# Patient Record
Sex: Female | Born: 1940 | Race: White | Hispanic: No | Smoking: Never smoker
Health system: Southern US, Community
[De-identification: ages and names within clinical notes are randomized; demographics above are authoritative.]

## PROBLEM LIST (undated history)

## (undated) DIAGNOSIS — R232 Flushing: Secondary | ICD-10-CM

## (undated) DIAGNOSIS — M75102 Unspecified rotator cuff tear or rupture of left shoulder, not specified as traumatic: Secondary | ICD-10-CM

## (undated) DIAGNOSIS — E785 Hyperlipidemia, unspecified: Secondary | ICD-10-CM

## (undated) DIAGNOSIS — F419 Anxiety disorder, unspecified: Secondary | ICD-10-CM

## (undated) DIAGNOSIS — G2581 Restless legs syndrome: Secondary | ICD-10-CM

## (undated) DIAGNOSIS — J309 Allergic rhinitis, unspecified: Secondary | ICD-10-CM

## (undated) DIAGNOSIS — G4700 Insomnia, unspecified: Secondary | ICD-10-CM

## (undated) DIAGNOSIS — Z87898 Personal history of other specified conditions: Secondary | ICD-10-CM

## (undated) DIAGNOSIS — I10 Essential (primary) hypertension: Secondary | ICD-10-CM

## (undated) HISTORY — DX: Hyperlipidemia, unspecified: E78.5

## (undated) HISTORY — DX: Flushing: R23.2

## (undated) HISTORY — DX: Unspecified rotator cuff tear or rupture of left shoulder, not specified as traumatic: M75.102

## (undated) HISTORY — DX: Essential (primary) hypertension: I10

## (undated) HISTORY — DX: Restless legs syndrome: G25.81

## (undated) HISTORY — DX: Insomnia, unspecified: G47.00

## (undated) HISTORY — DX: Personal history of other specified conditions: Z87.898

## (undated) HISTORY — PX: ABDOMINAL HYSTERECTOMY: SHX81

## (undated) HISTORY — DX: Anxiety disorder, unspecified: F41.9

## (undated) HISTORY — DX: Allergic rhinitis, unspecified: J30.9

---

## 2013-07-14 ENCOUNTER — Ambulatory Visit (INDEPENDENT_AMBULATORY_CARE_PROVIDER_SITE_OTHER): Payer: Medicare Other | Admitting: Cardiology

## 2013-07-14 ENCOUNTER — Encounter (INDEPENDENT_AMBULATORY_CARE_PROVIDER_SITE_OTHER): Payer: Self-pay

## 2013-07-14 ENCOUNTER — Encounter: Payer: Self-pay | Admitting: *Deleted

## 2013-07-14 VITALS — BP 160/88 | HR 104 | Ht 61.0 in | Wt 144.0 lb

## 2013-07-14 DIAGNOSIS — R42 Dizziness and giddiness: Secondary | ICD-10-CM

## 2013-07-14 NOTE — Progress Notes (Signed)
Patient ID: Deborah Wall, female   DOB: 21-Oct-1940, 72 y.o.   MRN: [id number]     Patient Name: Deborah Wall Date of Encounter: 07/14/2013  Primary Care Provider:  No primary provider on file. Primary Cardiologist:  Tobias Alexander, H   Problem List   Past Medical History  Diagnosis Date  . Left rotator cuff tear   . HTN (hypertension)   . Hyperlipidemia   . Insomnia   . Anxiety   . Restless leg syndrome   . Hot flashes   . Allergic rhinitis   . H/O gynecomastia    Past Surgical History  Procedure Laterality Date  . Abdominal hysterectomy      Allergies  Allergies  Allergen Reactions  . Other     BEES= USES EPIPEN  . Vicodin [Hydrocodone-Acetaminophen]     JITTERY    HPI  72 year old younder appearing female with no prior cardiac history but a significant FH of CAD on both side of family. Multiple family members died in their 71' of myocardial infarction.  The patient complains that recently she started to feel significant shortness of breath on exertion that is progressively worsening, she used an example where walking to mailbox makes her short of breath. This is not associated with any chest pain. 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 This is very unusual for her and she is very concerned. She denies any prior history of syncope but states that recently standing up from sitting or laying makes her feel dizzy. She denies any orthopnea paroxysmal nocturnal dyspnea or lower extremity edema. She also complains of calf pain associated with walking especially on the right.  Home Medications  Prior to Admission medications   Medication Sig Start Date End Date Taking? Authorizing Provider  Cetirizine HCl (ZYRTEC ALLERGY PO) Take 10 mg by mouth daily.   Yes Historical Provider, MD  chlorpheniramine (CHLOR-TRIMETON) 4 MG tablet Take 4 mg by mouth as directed.   Yes Historical  Provider, MD  citalopram (CELEXA) 20 MG tablet Take 20 mg by mouth daily.   Yes Historical Provider, MD  docusate sodium (COLACE) 100 MG capsule Take 100 mg by mouth as directed.   Yes Historical Provider, MD  doxylamine, Sleep, (UNISOM) 25 MG tablet Take 25 mg by mouth at bedtime as needed.   Yes Historical Provider, MD  EPINEPHrine (EPIPEN IJ) Inject as directed.   Yes Historical Provider, MD  estrogens, conjugated, (PREMARIN) 0.3 MG tablet Take 0.3 mg by mouth daily. Take daily for 21 days then do not take for 7 days.   Yes Historical Provider, MD  estrogens, conjugated, (PREMARIN) 0.625 MG tablet Take 0.625 mg by mouth daily. Take daily for 21 days then do not take for 7 days.   Yes Historical Provider, MD  fluticasone (VERAMYST) 27.5 MCG/SPRAY nasal spray Place 2 sprays into the nose as directed.   Yes Historical Provider, MD  FLUTICASONE FUROATE NA Place into the nose.   Yes Historical Provider, MD  ibuprofen (ADVIL,MOTRIN) 800 MG tablet Take 800 mg by mouth every 8 (eight) hours as needed.   Yes Historical Provider, MD  rOPINIRole (REQUIP) 2 MG tablet Take 2 mg by mouth at bedtime.   Yes Historical Provider, MD  tizanidine (ZANAFLEX) 6 MG capsule Take 6 mg by mouth as directed.   Yes Historical Provider, MD    Family History  Family History  Problem Relation  Age of Onset  . Heart disease Mother   . Cancer Father     Social History  History   Social History  . Marital Status: Widowed    Spouse Name: N/A    Number of Children: N/A  . Years of Education: N/A   Occupational History  . Not on file.   Social History Main Topics  . Smoking status: Never Smoker   . Smokeless tobacco: Not on file  . Alcohol Use: No  . Drug Use: No  . Sexual Activity: Not on file   Other Topics Concern  . Not on file   Social History Narrative  . No narrative on file     Review of Systems, as per HPI, otherwise negative General:  No chills, fever, night sweats or weight changes.    Cardiovascular:  No chest pain, dyspnea on exertion, edema, orthopnea, palpitations, paroxysmal nocturnal dyspnea. Dermatological: No rash, lesions/masses Respiratory: No cough, dyspnea Urologic: No hematuria, dysuria Abdominal:   No nausea, vomiting, diarrhea, bright red blood per rectum, melena, or hematemesis Neurologic:  No visual changes, wkns, changes in mental status. All other systems reviewed and are otherwise negative except as noted above.  Physical Exam  Blood pressure 160/88, pulse 104, height 5\' 1"  (1.549 m), weight 144 lb (65.318 kg).  General: Pleasant, NAD Psych: Normal affect. Neuro: Alert and oriented X 3. Moves all extremities spontaneously. HEENT: Normal  Neck: Supple without bruits or JVD. Lungs:  Resp regular and unlabored, CTA. Heart: RRR no s3, s4, or murmurs. Abdomen: Soft, non-tender, non-distended, BS + x 4.  Extremities: No clubbing, cyanosis or edema. DP/PT/Radials 2+ and equal bilaterally.  Labs:  No results found for this basename: CKTOTAL, CKMB, TROPONINI,  in the last 72 hours No results found for this basename: WBC, HGB, HCT, MCV, PLT   No results found for this basename: NA, K, CL, CO2, BUN, CREATININE, CALCIUM, LABALBU, PROT, BILITOT, ALKPHOS, ALT, AST, GLUCOSE,  in the last 168 hours No results found for this basename: CHOL, HDL, LDLCALC, TRIG   No results found for this basename: DDIMER   No components found with this basename: POCBNP,   Accessory Clinical Findings  echocardiogram  ECG - sinus rhythm, 69 beats per minute, normal EKG    Assessment & Plan  72 year old female with significant FH of CAD on both side of family, 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 DOE - multiple risk factors - we will order an exercise treadmill stress test  2. Hypertension - uncontrolled, states that ok at home, if elevated at the stress test and hypertensive response during the test we will add antihypertensives  3. HLP  - just checked by PCP and started on lipitor  4. Claudications - weak pulses on the right, we will order B/L ABIs  5. Dizziness mostly orthostatic but sometimes not after standing up, we will order sono carotids b/l  Follow up in 1 month   Lars Masson, MD, [facility] 07/14/2013, 4:15 PM

## 2013-07-14 NOTE — Patient Instructions (Signed)
Your physician has requested that you have an ankle brachial index (ABI). During this test an ultrasound and blood pressure cuff are used to evaluate the arteries that supply the arms and legs with blood. Allow thirty minutes for this exam. There are no restrictions or special instructions.  Your physician has requested that you have en exercise stress myoview. Please follow instruction sheet, as given.  Your physician has requested that you have a carotid duplex. This test is an ultrasound of the carotid arteries in your neck. It looks at blood flow through these arteries that supply the brain with blood. Allow one hour for this exam. There are no restrictions or special instructions.  Your physician recommends that you schedule a follow-up appointment in: WITH DR. Delton See AFTER ALL TEST ARE COMPLETED TO REVIEW RESULTS

## 2013-07-20 ENCOUNTER — Ambulatory Visit (HOSPITAL_COMMUNITY): Payer: Medicare Other | Attending: Cardiology

## 2013-07-20 ENCOUNTER — Ambulatory Visit (HOSPITAL_BASED_OUTPATIENT_CLINIC_OR_DEPARTMENT_OTHER): Payer: Medicare Other

## 2013-07-20 DIAGNOSIS — R42 Dizziness and giddiness: Secondary | ICD-10-CM | POA: Insufficient documentation

## 2013-07-20 DIAGNOSIS — I6529 Occlusion and stenosis of unspecified carotid artery: Secondary | ICD-10-CM

## 2013-07-20 DIAGNOSIS — I739 Peripheral vascular disease, unspecified: Secondary | ICD-10-CM

## 2013-07-22 ENCOUNTER — Encounter (HOSPITAL_COMMUNITY): Payer: Self-pay | Admitting: Cardiology

## 2013-08-03 ENCOUNTER — Encounter: Payer: Self-pay | Admitting: *Deleted

## 2013-08-03 ENCOUNTER — Encounter: Payer: Self-pay | Admitting: Cardiology

## 2013-08-03 ENCOUNTER — Ambulatory Visit (HOSPITAL_COMMUNITY): Payer: Medicare Other | Attending: Cardiology | Admitting: Radiology

## 2013-08-03 VITALS — BP 175/70 | HR 54 | Ht 61.0 in | Wt 141.0 lb

## 2013-08-03 DIAGNOSIS — R0989 Other specified symptoms and signs involving the circulatory and respiratory systems: Secondary | ICD-10-CM | POA: Insufficient documentation

## 2013-08-03 DIAGNOSIS — R0602 Shortness of breath: Secondary | ICD-10-CM

## 2013-08-03 DIAGNOSIS — Z8249 Family history of ischemic heart disease and other diseases of the circulatory system: Secondary | ICD-10-CM | POA: Insufficient documentation

## 2013-08-03 DIAGNOSIS — R0609 Other forms of dyspnea: Secondary | ICD-10-CM | POA: Insufficient documentation

## 2013-08-03 DIAGNOSIS — R42 Dizziness and giddiness: Secondary | ICD-10-CM

## 2013-08-03 MED ORDER — TECHNETIUM TC 99M SESTAMIBI GENERIC - CARDIOLITE
30.0000 | Freq: Once | INTRAVENOUS | Status: AC | PRN
  Administered 2013-08-03: 30 via INTRAVENOUS

## 2013-08-03 MED ORDER — TECHNETIUM TC 99M SESTAMIBI GENERIC - CARDIOLITE
10.0000 | Freq: Once | INTRAVENOUS | Status: AC | PRN
  Administered 2013-08-03: 10 via INTRAVENOUS

## 2013-08-03 MED ORDER — REGADENOSON 0.4 MG/5ML IV SOLN
0.4000 mg | Freq: Once | INTRAVENOUS | Status: AC
  Administered 2013-08-03: 0.4 mg via INTRAVENOUS

## 2013-08-03 NOTE — Progress Notes (Signed)
[facility] SITE 3 NUCLEAR MED [address] [place], [place] [address] [phone]    Cardiology Nuclear Med Study  Deborah Wall is a 72 y.o. female     MRN : [id number]     DOB: [birth year]  Procedure Date: 08/03/2013  Nuclear Med Background Indication for Stress Test:  Evaluation for Ischemia History:  No Cardiac History Cardiac Risk Factors: Family History - CAD and Lipids  Symptoms:  Dizziness and DOE   Nuclear Pre-Procedure Caffeine/Decaff Intake:  None NPO After: 8:00pm   Lungs:  clear O2 Sat: 99% on room air. IV 0.9% NS with Angio Cath:  22g  IV Site: R Hand  IV Started by:  Bonnita Levan, RN  Chest Size (in):  36 Cup Size: C  Height: 5\' 1"  (1.549 m)  Weight:  141 lb (63.957 kg)  BMI:  Body mass index is 26.66 kg/(m^2). Tech Comments:  N/A    Nuclear Med Study 1 or 2 day study: 1 day  Stress Test Type:  Stress  Reading MD: [name], MD  Order Authorizing Provider:  Tobias Alexander, MD  Resting Radionuclide: Technetium 26m Sestamibi  Resting Radionuclide Dose: 11.0 mCi   Stress Radionuclide:  Technetium 32m Sestamibi  Stress Radionuclide Dose: 33.0 mCi           Stress Protocol Rest HR: 64 Stress HR: 74  Rest BP: 175/70 Stress BP: 122/66  Exercise Time (min): n/a METS: n/a           Dose of Adenosine (mg):  n/a Dose of Lexiscan: 0.4 mg  Dose of Atropine (mg): n/a Dose of Dobutamine: n/a mcg/kg/min (at max HR)  Stress Test Technologist: Nelson Chimes, BS-ES  Nuclear Technologist:  Domenic Polite, CNMT     Rest Procedure:  Myocardial perfusion imaging was performed at rest 45 minutes following the intravenous administration of Technetium 66m Sestamibi. Rest ECG: Sinus rhythm, nonspecific ST changes.  Stress Procedure:  The patient received IV Lexiscan 0.4 mg over 15-seconds.  Technetium 42m Sestamibi injected at 30-seconds.  Quantitative spect images were obtained after a 45 minute delay.  During the infusion, the patient stated she felt  funny.  This resolved in recovery. Initially attempted to walk the patient on Bruce Protocol.  Had to abort test due to severe leg discomfort and switched her to a sitting Lexiscan.  Stress ECG: No significant ST segment change suggestive of ischemia.  QPS Raw Data Images:  Acquisition technically good; normal left ventricular size. Stress Images:  Normal homogeneous uptake in all areas of the myocardium. Rest Images:  Normal homogeneous uptake in all areas of the myocardium. Subtraction (SDS):  No evidence of ischemia. Transient Ischemic Dilatation (Normal <1.22):  1.05 Lung/Heart Ratio (Normal <0.45):  0.29  Quantitative Gated Spect Images QGS EDV:  57 ml QGS ESV:  19 ml  Impression Exercise Capacity:  Lexiscan with no exercise. BP Response:  Normal blood pressure response. Clinical Symptoms:  No chest pain or dyspnea. ECG Impression:  No significant ST segment change suggestive of ischemia. Comparison with Prior Nuclear Study: No images to compare  Overall Impression:  Normal stress nuclear study.  LV Ejection Fraction: 66%.  LV Wall Motion:  NL LV Function; NL Wall Motion  [name]

## 2013-08-10 ENCOUNTER — Ambulatory Visit (INDEPENDENT_AMBULATORY_CARE_PROVIDER_SITE_OTHER): Payer: Medicare Other | Admitting: Cardiology

## 2013-08-10 ENCOUNTER — Encounter: Payer: Self-pay | Admitting: Cardiology

## 2013-08-10 VITALS — BP 158/84 | HR 74 | Ht 61.0 in | Wt 145.8 lb

## 2013-08-10 DIAGNOSIS — E785 Hyperlipidemia, unspecified: Secondary | ICD-10-CM

## 2013-08-10 DIAGNOSIS — R0609 Other forms of dyspnea: Secondary | ICD-10-CM

## 2013-08-10 DIAGNOSIS — I10 Essential (primary) hypertension: Secondary | ICD-10-CM | POA: Insufficient documentation

## 2013-08-10 DIAGNOSIS — R0989 Other specified symptoms and signs involving the circulatory and respiratory systems: Secondary | ICD-10-CM

## 2013-08-10 DIAGNOSIS — R0600 Dyspnea, unspecified: Secondary | ICD-10-CM | POA: Insufficient documentation

## 2013-08-10 MED ORDER — AMLODIPINE BESYLATE 2.5 MG PO TABS: 2.5000 mg | ORAL_TABLET | Freq: Every day | ORAL | Status: AC

## 2013-08-10 NOTE — Progress Notes (Signed)
Patient ID: Deborah Wall, female   DOB: [birth year], 72 y.o.   MRN: [id number]    Patient Name: Deborah Wall Date of Encounter: 08/10/2013  Primary Care Provider:  No primary provider on file. Primary Cardiologist:  Tobias Alexander, H  Problem List   Past Medical History  Diagnosis Date  . Left rotator cuff tear   . HTN (hypertension)   . Hyperlipidemia   . Insomnia   . Anxiety   . Restless leg syndrome   . Hot flashes   . Allergic rhinitis   . H/O gynecomastia    Past Surgical History  Procedure Laterality Date  . Abdominal hysterectomy      Allergies  Allergies  Allergen Reactions  . Other     BEES= USES EPIPEN  . Vicodin [Hydrocodone-Acetaminophen]     JITTERY    HPI  72 year old younger appearing female with no prior cardiac history but a significant FH of CAD on both side of family. Multiple family members died in their 60' of myocardial infarction.  The patient complains that recently she started to feel significant shortness of breath on exertion that is progressively worsening, she used an example where walking to mailbox makes her short of breath. This is not associated with any chest pain. 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 This is very unusual for her and she is very concerned. She denies any prior history of syncope but states that recently standing up from sitting or laying makes her feel dizzy. She denies any orthopnea paroxysmal nocturnal dyspnea or lower extremity edema. She also complains of calf pain associated with walking especially on the right.  This is 3 weeks follow up, she reports no change in her symptoms.  Home Medications  Prior to Admission medications   Medication Sig Start Date End Date Taking? Authorizing Provider  Cetirizine HCl (ZYRTEC ALLERGY PO) Take 10 mg by mouth daily.   Yes Historical Provider, MD  chlorpheniramine  (CHLOR-TRIMETON) 4 MG tablet Take 4 mg by mouth as directed.   Yes Historical Provider, MD  citalopram (CELEXA) 20 MG tablet Take 20 mg by mouth daily.   Yes Historical Provider, MD  docusate sodium (COLACE) 100 MG capsule Take 100 mg by mouth as directed.   Yes Historical Provider, MD  doxylamine, Sleep, (UNISOM) 25 MG tablet Take 25 mg by mouth at bedtime as needed.   Yes Historical Provider, MD  EPINEPHrine (EPIPEN IJ) Inject as directed.   Yes Historical Provider, MD  estrogens, conjugated, (PREMARIN) 0.3 MG tablet Take 0.3 mg by mouth daily. Take daily for 21 days then do not take for 7 days.   Yes Historical Provider, MD  estrogens, conjugated, (PREMARIN) 0.625 MG tablet Take 0.625 mg by mouth daily. Take daily for 21 days then do not take for 7 days.   Yes Historical Provider, MD  fluticasone (VERAMYST) 27.5 MCG/SPRAY nasal spray Place 2 sprays into the nose as directed.   Yes Historical Provider, MD  FLUTICASONE FUROATE NA Place into the nose.   Yes Historical Provider, MD  ibuprofen (ADVIL,MOTRIN) 800 MG tablet Take 800 mg by mouth every 8 (eight) hours as needed.   Yes Historical Provider, MD  rOPINIRole (REQUIP) 2 MG tablet Take 2 mg by mouth at bedtime.   Yes Historical Provider, MD  tizanidine (ZANAFLEX) 6 MG capsule Take 6 mg by mouth as directed.   Yes Historical Provider,  MD    Family History  Family History  Problem Relation Age of Onset  . Heart disease Mother   . Cancer Father    Social History  History   Social History  . Marital Status: Widowed    Spouse Name: N/A    Number of Children: N/A  . Years of Education: N/A   Occupational History  . Not on file.   Social History Main Topics  . Smoking status: Never Smoker   . Smokeless tobacco: Not on file  . Alcohol Use: No  . Drug Use: No  . Sexual Activity: Not on file   Other Topics Concern  . Not on file   Social History Narrative  . No narrative on file    Review of Systems, as per HPI, otherwise  negative General:  No chills, fever, night sweats or weight changes.  Cardiovascular:  No chest pain, dyspnea on exertion, edema, orthopnea, palpitations, paroxysmal nocturnal dyspnea. Dermatological: No rash, lesions/masses Respiratory: No cough, dyspnea Urologic: No hematuria, dysuria Abdominal:   No nausea, vomiting, diarrhea, bright red blood per rectum, melena, or hematemesis Neurologic:  No visual changes, wkns, changes in mental status. All other systems reviewed and are otherwise negative except as noted above.  Physical Exam BP 158/84, HR 74 BPM Height 5\' 1"  (1.549 m).  General: Pleasant, NAD Psych: Normal affect. Neuro: Alert and oriented X 3. Moves all extremities spontaneously. HEENT: Normal  Neck: Supple without bruits or JVD. Lungs:  Resp regular and unlabored, CTA. Heart: RRR no s3, s4, or murmurs. Abdomen: Soft, non-tender, non-distended, BS + x 4.  Extremities: No clubbing, cyanosis or edema. DP/PT/Radials 2+ and equal bilaterally.  Labs: None.  Accessory Clinical Findings  Echocardiogram - none  ECG - sinus rhythm, 69 beats per minute, normal EKG   Lexiscan nuclear stress test 08/04/13 Quantitative Gated Spect Images  QGS EDV: 57 ml  QGS ESV: 19 ml  Impression  Exercise Capacity: Lexiscan with no exercise.  BP Response: Normal blood pressure response.  Clinical Symptoms: No chest pain or dyspnea.  ECG Impression: No significant ST segment change suggestive of ischemia.  Comparison with Prior Nuclear Study: No images to compare  Overall Impression: Normal stress nuclear study.  LV Ejection Fraction: 66%. 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 DOE - multiple risk factors - Lexiscan nuclear stress test showed normal LVEF and no scar or ischemia. Poor exercise capacity with  hypertensive BP response. We will add amlodipine 2.5 mg po daily.  2. Hypertension - uncontrolled, hypertensive BP response. We will add amlodipine 2.5 mg po daily.  3. HLP - just checked by PCP and started on lipitor  4. 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 Dizziness mostly orthostatic but sometimes not after standing up, sono carotids shows just mild plague b/l (1-39% stenosis)  Follow up in 1 month   Lars Masson, MD, [facility] 08/10/2013, 1:59 PM

## 2013-08-10 NOTE — Patient Instructions (Signed)
Start Amlodipine 2.5 mg daily    Your physician recommends that you schedule a follow-up appointment in: 1 month

## 2013-09-03 ENCOUNTER — Encounter: Payer: Self-pay | Admitting: Cardiology

## 2013-09-03 ENCOUNTER — Ambulatory Visit (INDEPENDENT_AMBULATORY_CARE_PROVIDER_SITE_OTHER): Payer: Medicare Other | Admitting: Cardiology

## 2013-09-03 VITALS — BP 140/90 | HR 67 | Ht 61.0 in | Wt 145.0 lb

## 2013-09-03 DIAGNOSIS — I10 Essential (primary) hypertension: Secondary | ICD-10-CM

## 2013-09-03 DIAGNOSIS — E785 Hyperlipidemia, unspecified: Secondary | ICD-10-CM

## 2013-09-03 LAB — COMPREHENSIVE METABOLIC PANEL
ALT: 16 U/L (ref 0–35)
AST: 28 U/L (ref 0–37)
Albumin: 4 g/dL (ref 3.5–5.2)
Alkaline Phosphatase: 48 U/L (ref 39–117)
BUN: 26 mg/dL — ABNORMAL HIGH (ref 6–23)
CO2: 28 mEq/L (ref 19–32)
Calcium: 9.3 mg/dL (ref 8.4–10.5)
Chloride: 102 mEq/L (ref 96–112)
Creatinine, Ser: 0.9 mg/dL (ref 0.4–1.2)
GFR: 68.61 mL/min (ref >60.00)
Glucose, Bld: 79 mg/dL (ref 70–99)
Potassium: 3.5 mEq/L (ref 3.5–5.1)
Sodium: 138 mEq/L (ref 135–145)
Total Bilirubin: 1.4 mg/dL — ABNORMAL HIGH (ref 0.3–1.2)
Total Protein: 7.1 g/dL (ref 6.0–8.3)

## 2013-09-03 NOTE — Addendum Note (Signed)
Addended by: Tonita PhoenixBOWDEN, Shamon Cothran K on: 09/03/2013 02:26 PM   Modules accepted: Orders

## 2013-09-03 NOTE — Patient Instructions (Signed)
Your physician recommends that you return for lab work today for Comp Met.  Your physician wants you to follow-up in: 6 months with Dr. Meda Coffee. You will receive a reminder letter in the mail two months in advance. If you don't receive a letter, please call our office to schedule the follow-up appointment.

## 2013-09-03 NOTE — Progress Notes (Signed)
Patient ID: Deborah Wall, female   DOB: 04/10/1941, 73 y.o.   MRN: [id number]    Patient Name: Deborah Wall Date of Encounter: 09/03/2013  Primary Care Provider:  Sissy HoffSWAYNE,DAVID W, MD Primary Cardiologist:  Tobias AlexanderNELSON, Tilla Wilborn, H  Problem List   Past Medical History  Diagnosis Date  . Left rotator cuff tear   . HTN (hypertension)   . Hyperlipidemia   . Insomnia   . Anxiety   . Restless leg syndrome   . Hot flashes   . Allergic rhinitis   . H/O gynecomastia    Past Surgical History  Procedure Laterality Date  . Abdominal hysterectomy     Allergies  Allergies  Allergen Reactions  . Other     BEES= USES EPIPEN  . Vicodin [Hydrocodone-Acetaminophen]     JITTERY    HPI  73 year old younger appearing female with no prior cardiac history but a significant FH of CAD on both side of family. Multiple family members died in their 1940' of myocardial infarction.  The patient complains that recently she started to feel significant shortness of breath on exertion that is progressively worsening, she used an example where walking to mailbox makes her short of breath. This is not associated with any chest pain. 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 This is very unusual for her and she is very concerned. She denies any prior history of syncope but states that recently standing up from sitting or laying makes her feel dizzy. She denies any orthopnea paroxysmal nocturnal dyspnea or lower extremity edema. She also complains of calf pain associated with walking especially on the right.  She is coming after 1 month, she is asymptomatic, and was able to rack leaves for 3 hours yesterday without being SOB. No CP.    Home Medications  Prior to Admission medications   Medication Sig Start Date End Date Taking? Authorizing Provider  Cetirizine HCl (ZYRTEC ALLERGY PO) Take 10 mg by mouth daily.   Yes  Historical Provider, MD  chlorpheniramine (CHLOR-TRIMETON) 4 MG tablet Take 4 mg by mouth as directed.   Yes Historical Provider, MD  citalopram (CELEXA) 20 MG tablet Take 20 mg by mouth daily.   Yes Historical Provider, MD  docusate sodium (COLACE) 100 MG capsule Take 100 mg by mouth as directed.   Yes Historical Provider, MD  doxylamine, Sleep, (UNISOM) 25 MG tablet Take 25 mg by mouth at bedtime as needed.   Yes Historical Provider, MD  EPINEPHrine (EPIPEN IJ) Inject as directed.   Yes Historical Provider, MD  estrogens, conjugated, (PREMARIN) 0.3 MG tablet Take 0.3 mg by mouth daily. Take daily for 21 days then do not take for 7 days.   Yes Historical Provider, MD  estrogens, conjugated, (PREMARIN) 0.625 MG tablet Take 0.625 mg by mouth daily. Take daily for 21 days then do not take for 7 days.   Yes Historical Provider, MD  fluticasone (VERAMYST) 27.5 MCG/SPRAY nasal spray Place 2 sprays into the nose as directed.   Yes Historical Provider, MD  FLUTICASONE FUROATE NA Place into the nose.   Yes Historical Provider, MD  ibuprofen (ADVIL,MOTRIN) 800 MG tablet Take 800 mg by mouth every 8 (eight) hours as needed.   Yes Historical Provider, MD  rOPINIRole (REQUIP) 2 MG tablet Take 2 mg by mouth at bedtime.   Yes Historical Provider, MD  tizanidine (ZANAFLEX) 6 MG capsule Take 6  mg by mouth as directed.   Yes Historical Provider, MD    Family History  Family History  Problem Relation Age of Onset  . Heart disease Mother   . Cancer Father    Social History  History   Social History  . Marital Status: Widowed    Spouse Name: N/A    Number of Children: N/A  . Years of Education: N/A   Occupational History  . Not on file.   Social History Main Topics  . Smoking status: Never Smoker   . Smokeless tobacco: Not on file  . Alcohol Use: No  . Drug Use: No  . Sexual Activity: Not on file   Other Topics Concern  . Not on file   Social History Narrative  . No narrative on file      Review of Systems, as per HPI, otherwise negative General:  No chills, fever, night sweats or weight changes.  Cardiovascular:  No chest pain, dyspnea on exertion, edema, orthopnea, palpitations, paroxysmal nocturnal dyspnea. Dermatological: No rash, lesions/masses Respiratory: No cough, dyspnea Urologic: No hematuria, dysuria Abdominal:   No nausea, vomiting, diarrhea, bright red blood per rectum, melena, or hematemesis Neurologic:  No visual changes, wkns, changes in mental status. All other systems reviewed and are otherwise negative except as noted above.  Physical Exam BP 158/84, HR 74 BPM Blood pressure 140/90, pulse 67, height 5\' 1"  (1.549 m), weight 145 lb (65.772 kg).  General: Pleasant, NAD Psych: Normal affect. Neuro: Alert and oriented X 3. Moves all extremities spontaneously. HEENT: Normal  Neck: Supple without bruits or JVD. Lungs:  Resp regular and unlabored, CTA. Heart: RRR no s3, s4, or murmurs. Abdomen: Soft, non-tender, non-distended, BS + x 4.  Extremities: No clubbing, cyanosis or edema. DP/PT/Radials 2+ and equal bilaterally.  Labs: None.  Accessory Clinical Findings  Echocardiogram - none  ECG - sinus rhythm, 69 beats per minute, normal EKG   Lexiscan nuclear stress test 08/04/13 Quantitative Gated Spect Images  QGS EDV: 57 ml  QGS ESV: 19 ml  Impression  Exercise Capacity: Lexiscan with no exercise.  BP Response: Normal blood pressure response.  Clinical Symptoms: No chest pain or dyspnea.  ECG Impression: No significant ST segment change suggestive of ischemia.  Comparison with Prior Nuclear Study: No images to compare  Overall Impression: Normal stress nuclear study.  LV Ejection Fraction: 66%. 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 DOE - multiple risk factors  - Lexiscan nuclear stress test showed normal LVEF and no scar or ischemia. Poor exercise capacity with hypertensive BP response.  Mild improvement after adding amlodipine 2.5 mg po daily.   2. Hypertension - repeated was controlled 138/75 mmHg, we will continue the same regimen.   3. HLP - just checked by PCP and started on lipitor, we will check liver enzymes today.  4. 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 Dizziness mostly orthostatic but sometimes not after standing up, sono carotids shows just mild plague b/l (1-39% stenosis)  Follow up in 6 months   Lars Masson, MD, [facility] 09/03/2013, 2:08 PM

## 2013-10-15 ENCOUNTER — Other Ambulatory Visit: Payer: Self-pay

## 2013-10-15 DIAGNOSIS — Z1231 Encounter for screening mammogram for malignant neoplasm of breast: Secondary | ICD-10-CM

## 2013-10-19 ENCOUNTER — Ambulatory Visit
Admission: RE | Admit: 2013-10-19 | Discharge: 2013-10-19 | Disposition: A | Discharge status: Home / Self Care | Payer: Medicare Other | Source: Ambulatory Visit | Attending: Family Medicine | Admitting: Family Medicine

## 2013-10-19 ENCOUNTER — Other Ambulatory Visit: Payer: Self-pay | Admitting: Family Medicine

## 2013-10-19 DIAGNOSIS — M25552 Pain in left hip: Secondary | ICD-10-CM

## 2013-10-28 ENCOUNTER — Ambulatory Visit
Admission: RE | Admit: 2013-10-28 | Discharge: 2013-10-28 | Disposition: A | Discharge status: Home / Self Care | Payer: Medicare Other | Source: Ambulatory Visit

## 2013-10-28 DIAGNOSIS — Z1231 Encounter for screening mammogram for malignant neoplasm of breast: Secondary | ICD-10-CM

## 2014-04-20 IMAGING — CR DG HIP W/ PELVIS BILAT
5 series · 5 of 5 positions shown · non-contrast
Comparison: none

[view not recorded (1 of 5)]
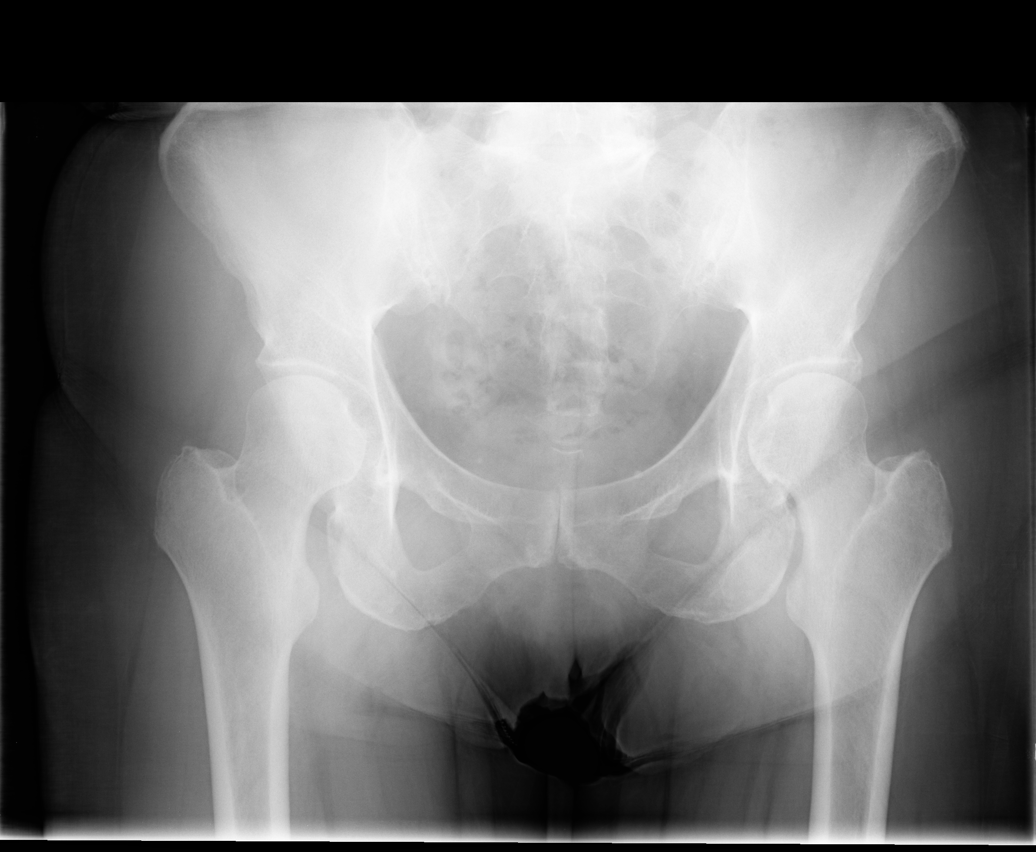

[view not recorded (2 of 5)]
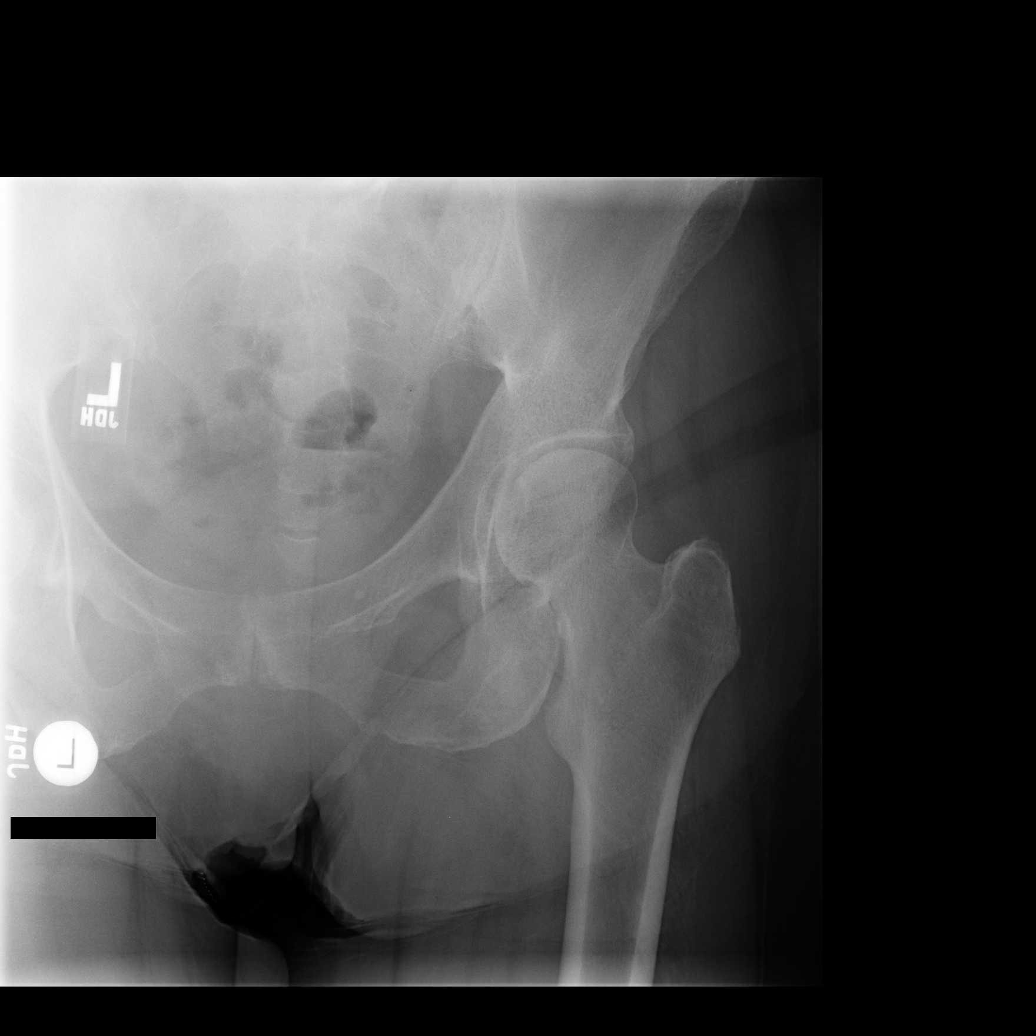

[view not recorded (3 of 5)]
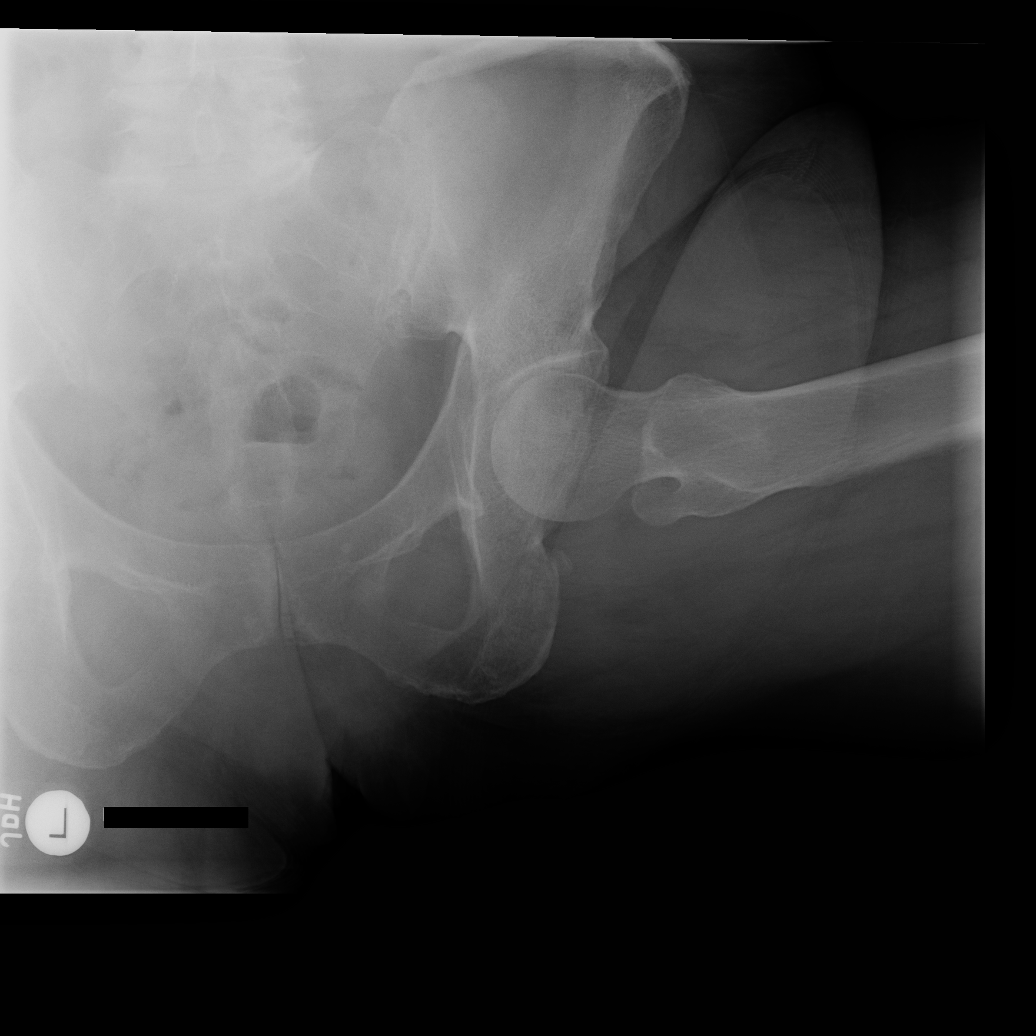

[view not recorded (4 of 5)]
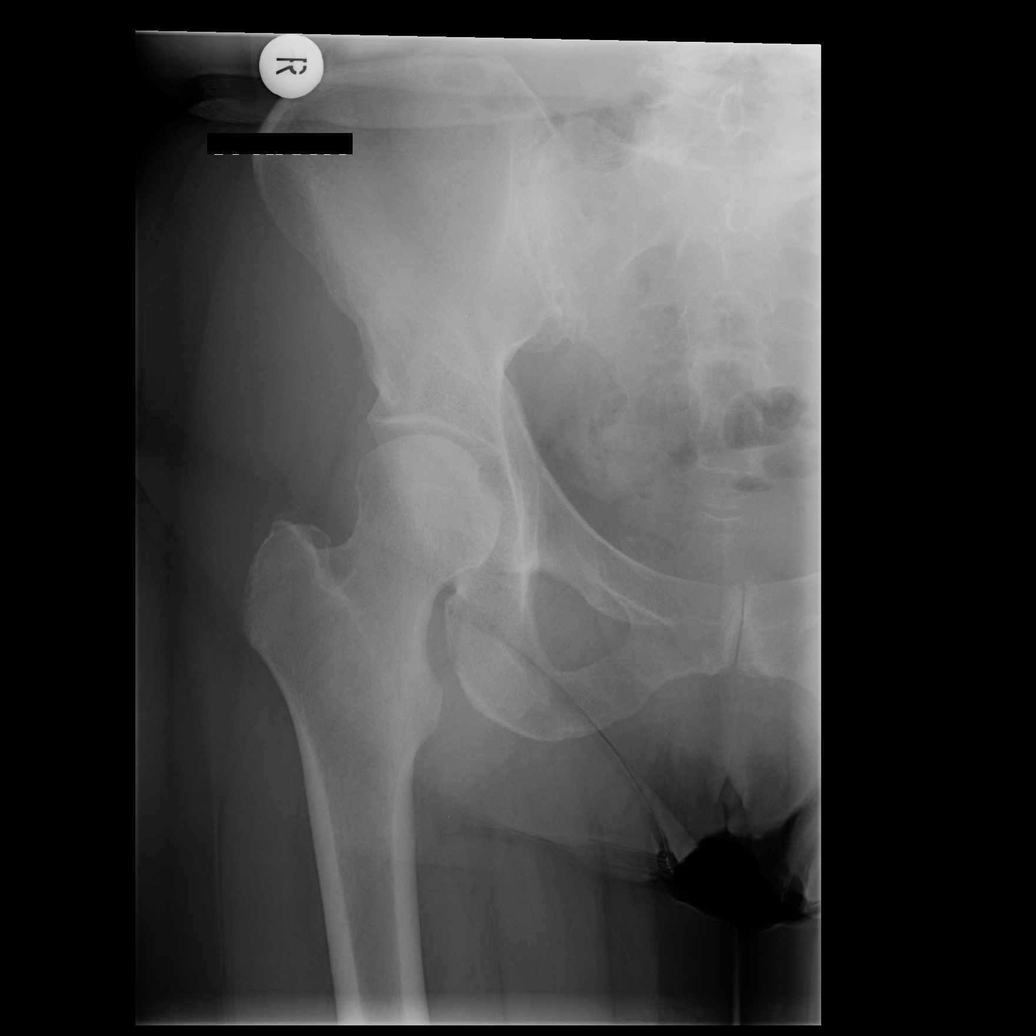

[view not recorded (5 of 5)]
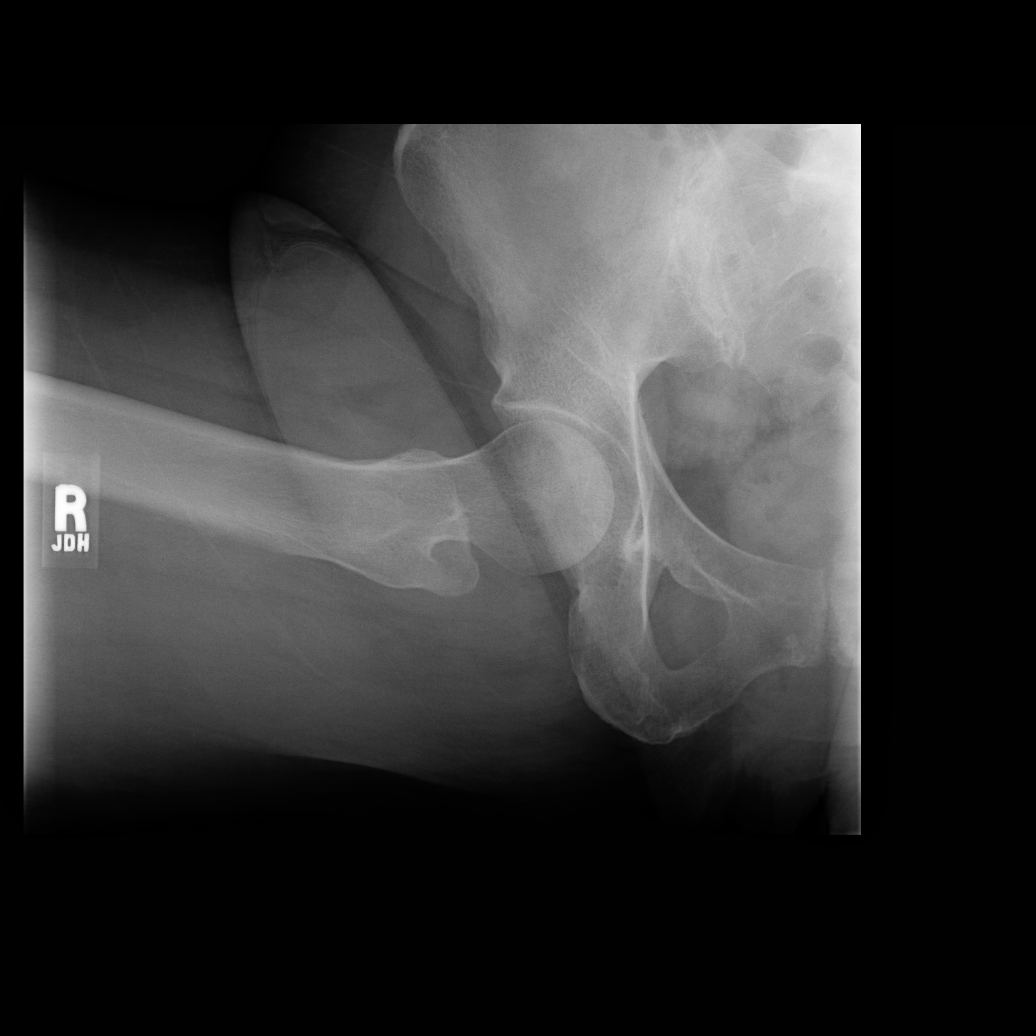

[5 of 5 positions shown; findings below may reference images not displayed]

CLINICAL DATA
Fall with contusion to the left hip.

EXAM
BILATERAL HIP WITH PELVIS - 4+ VIEW

COMPARISON
None.

FINDINGS
Obturator rings appear intact. Sacrum and sacral arcades appear
normal. Mild right SI joint osteoarthritis. Mild left hip
osteoarthritis present with tiny marginal osteophyte off the
inferior aspect of the femoral head. Minimal joint space narrowing
of the left hip relative to the right.

IMPRESSION
No fracture or acute osseous abnormality. Mild left hip
osteoarthritis.

SIGNATURE
